# Patient Record
Sex: Female | Born: 1960
Health system: Southern US, Community
[De-identification: ages and names within clinical notes are randomized; demographics above are authoritative.]

## PROBLEM LIST (undated history)

## (undated) DIAGNOSIS — G35 Multiple sclerosis: Secondary | ICD-10-CM

## (undated) HISTORY — PX: TUBAL LIGATION: SHX77

## (undated) HISTORY — DX: Multiple sclerosis: G35

---

## 2001-12-27 ENCOUNTER — Encounter: Payer: Self-pay | Admitting: Obstetrics and Gynecology

## 2001-12-27 ENCOUNTER — Ambulatory Visit (HOSPITAL_COMMUNITY): Admission: RE | Admit: 2001-12-27 | Discharge: 2001-12-27 | Payer: Self-pay | Admitting: Obstetrics and Gynecology

## 2002-12-31 ENCOUNTER — Encounter: Payer: Self-pay | Admitting: Obstetrics and Gynecology

## 2002-12-31 ENCOUNTER — Ambulatory Visit (HOSPITAL_COMMUNITY): Admission: RE | Admit: 2002-12-31 | Discharge: 2002-12-31 | Payer: Self-pay | Admitting: Obstetrics and Gynecology

## 2004-01-27 ENCOUNTER — Ambulatory Visit (HOSPITAL_COMMUNITY): Admission: RE | Admit: 2004-01-27 | Discharge: 2004-01-27 | Payer: Self-pay | Admitting: Obstetrics and Gynecology

## 2005-02-02 ENCOUNTER — Ambulatory Visit (HOSPITAL_COMMUNITY): Admission: RE | Admit: 2005-02-02 | Discharge: 2005-02-02 | Payer: Self-pay | Admitting: Obstetrics and Gynecology

## 2006-02-03 ENCOUNTER — Ambulatory Visit (HOSPITAL_COMMUNITY): Admission: RE | Admit: 2006-02-03 | Discharge: 2006-02-03 | Payer: Self-pay | Admitting: Obstetrics and Gynecology

## 2007-03-02 ENCOUNTER — Ambulatory Visit (HOSPITAL_COMMUNITY): Admission: RE | Admit: 2007-03-02 | Discharge: 2007-03-02 | Payer: Self-pay | Admitting: Obstetrics and Gynecology

## 2008-03-27 ENCOUNTER — Other Ambulatory Visit: Admission: RE | Admit: 2008-03-27 | Discharge: 2008-03-27 | Payer: Self-pay | Admitting: Obstetrics and Gynecology

## 2008-03-27 ENCOUNTER — Ambulatory Visit (HOSPITAL_COMMUNITY): Admission: RE | Admit: 2008-03-27 | Discharge: 2008-03-27 | Payer: Self-pay | Admitting: Obstetrics and Gynecology

## 2009-06-25 ENCOUNTER — Other Ambulatory Visit: Admission: RE | Admit: 2009-06-25 | Discharge: 2009-06-25 | Payer: Self-pay | Admitting: Obstetrics and Gynecology

## 2009-06-25 ENCOUNTER — Ambulatory Visit (HOSPITAL_COMMUNITY): Admission: RE | Admit: 2009-06-25 | Discharge: 2009-06-25 | Payer: Self-pay | Admitting: Obstetrics and Gynecology

## 2010-08-04 ENCOUNTER — Ambulatory Visit (HOSPITAL_COMMUNITY): Admission: RE | Admit: 2010-08-04 | Discharge: 2010-08-04 | Payer: Self-pay | Admitting: Obstetrics and Gynecology

## 2010-08-04 ENCOUNTER — Other Ambulatory Visit: Admission: RE | Admit: 2010-08-04 | Discharge: 2010-08-04 | Payer: Self-pay | Admitting: Obstetrics and Gynecology

## 2010-10-11 ENCOUNTER — Encounter: Payer: Self-pay | Admitting: Obstetrics and Gynecology

## 2011-08-10 ENCOUNTER — Other Ambulatory Visit: Payer: Self-pay | Admitting: Obstetrics and Gynecology

## 2011-08-10 DIAGNOSIS — Z139 Encounter for screening, unspecified: Secondary | ICD-10-CM

## 2011-10-05 ENCOUNTER — Other Ambulatory Visit (HOSPITAL_COMMUNITY)
Admission: RE | Admit: 2011-10-05 | Discharge: 2011-10-05 | Disposition: A | Discharge status: Home / Self Care | Payer: BC Managed Care – PPO | Source: Ambulatory Visit | Attending: Obstetrics and Gynecology | Admitting: Obstetrics and Gynecology

## 2011-10-05 ENCOUNTER — Ambulatory Visit (HOSPITAL_COMMUNITY)
Admission: RE | Admit: 2011-10-05 | Discharge: 2011-10-05 | Disposition: A | Discharge status: Home / Self Care | Payer: BC Managed Care – PPO | Source: Ambulatory Visit | Attending: Obstetrics and Gynecology | Admitting: Obstetrics and Gynecology

## 2011-10-05 ENCOUNTER — Other Ambulatory Visit: Payer: Self-pay | Admitting: Adult Health

## 2011-10-05 DIAGNOSIS — Z1231 Encounter for screening mammogram for malignant neoplasm of breast: Secondary | ICD-10-CM | POA: Insufficient documentation

## 2011-10-05 DIAGNOSIS — Z139 Encounter for screening, unspecified: Secondary | ICD-10-CM

## 2011-10-05 DIAGNOSIS — Z01419 Encounter for gynecological examination (general) (routine) without abnormal findings: Secondary | ICD-10-CM | POA: Insufficient documentation

## 2012-10-11 ENCOUNTER — Other Ambulatory Visit: Payer: Self-pay | Admitting: Adult Health

## 2012-10-11 DIAGNOSIS — Z09 Encounter for follow-up examination after completed treatment for conditions other than malignant neoplasm: Secondary | ICD-10-CM

## 2012-10-27 ENCOUNTER — Other Ambulatory Visit (HOSPITAL_COMMUNITY)
Admission: RE | Admit: 2012-10-27 | Discharge: 2012-10-27 | Disposition: A | Discharge status: Home / Self Care | Payer: BC Managed Care – PPO | Source: Ambulatory Visit | Attending: Obstetrics and Gynecology | Admitting: Obstetrics and Gynecology

## 2012-10-27 ENCOUNTER — Other Ambulatory Visit: Payer: Self-pay | Admitting: Adult Health

## 2012-10-27 ENCOUNTER — Ambulatory Visit (HOSPITAL_COMMUNITY)
Admission: RE | Admit: 2012-10-27 | Discharge: 2012-10-27 | Disposition: A | Discharge status: Home / Self Care | Payer: BC Managed Care – PPO | Source: Ambulatory Visit | Attending: Adult Health | Admitting: Adult Health

## 2012-10-27 DIAGNOSIS — Z1151 Encounter for screening for human papillomavirus (HPV): Secondary | ICD-10-CM | POA: Insufficient documentation

## 2012-10-27 DIAGNOSIS — Z09 Encounter for follow-up examination after completed treatment for conditions other than malignant neoplasm: Secondary | ICD-10-CM

## 2012-10-27 DIAGNOSIS — Z01419 Encounter for gynecological examination (general) (routine) without abnormal findings: Secondary | ICD-10-CM | POA: Insufficient documentation

## 2012-10-27 DIAGNOSIS — Z1231 Encounter for screening mammogram for malignant neoplasm of breast: Secondary | ICD-10-CM | POA: Insufficient documentation

## 2013-12-17 ENCOUNTER — Other Ambulatory Visit: Payer: Self-pay | Admitting: Adult Health

## 2013-12-17 DIAGNOSIS — Z1231 Encounter for screening mammogram for malignant neoplasm of breast: Secondary | ICD-10-CM

## 2013-12-18 ENCOUNTER — Ambulatory Visit (HOSPITAL_COMMUNITY)
Admission: RE | Admit: 2013-12-18 | Discharge: 2013-12-18 | Disposition: A | Discharge status: Home / Self Care | Payer: No Typology Code available for payment source | Source: Ambulatory Visit | Attending: Adult Health | Admitting: Adult Health

## 2013-12-18 DIAGNOSIS — Z1231 Encounter for screening mammogram for malignant neoplasm of breast: Secondary | ICD-10-CM | POA: Insufficient documentation

## 2015-02-13 ENCOUNTER — Other Ambulatory Visit: Payer: Self-pay | Admitting: Adult Health

## 2015-02-13 DIAGNOSIS — Z1231 Encounter for screening mammogram for malignant neoplasm of breast: Secondary | ICD-10-CM

## 2015-02-24 ENCOUNTER — Other Ambulatory Visit: Payer: Self-pay | Admitting: Adult Health

## 2015-02-27 ENCOUNTER — Ambulatory Visit (HOSPITAL_COMMUNITY): Payer: No Typology Code available for payment source

## 2015-03-03 ENCOUNTER — Ambulatory Visit (INDEPENDENT_AMBULATORY_CARE_PROVIDER_SITE_OTHER): Payer: No Typology Code available for payment source | Admitting: Adult Health

## 2015-03-03 ENCOUNTER — Ambulatory Visit (HOSPITAL_COMMUNITY)
Admission: RE | Admit: 2015-03-03 | Discharge: 2015-03-03 | Disposition: A | Discharge status: Home / Self Care | Payer: No Typology Code available for payment source | Source: Ambulatory Visit | Attending: Adult Health | Admitting: Adult Health

## 2015-03-03 ENCOUNTER — Encounter: Payer: Self-pay | Admitting: Adult Health

## 2015-03-03 VITALS — BP 140/80 | HR 64 | Ht 66.5 in | Wt 210.5 lb

## 2015-03-03 DIAGNOSIS — Z01419 Encounter for gynecological examination (general) (routine) without abnormal findings: Secondary | ICD-10-CM | POA: Diagnosis not present

## 2015-03-03 DIAGNOSIS — Z1212 Encounter for screening for malignant neoplasm of rectum: Secondary | ICD-10-CM | POA: Diagnosis not present

## 2015-03-03 DIAGNOSIS — Z1231 Encounter for screening mammogram for malignant neoplasm of breast: Secondary | ICD-10-CM | POA: Insufficient documentation

## 2015-03-03 LAB — HEMOCCULT GUIAC POC 1CARD (OFFICE): Fecal Occult Blood, POC: NEGATIVE

## 2015-03-03 NOTE — Patient Instructions (Signed)
Pap and physical in 1 year Mammogram yearly Labs at work 

## 2015-03-03 NOTE — Progress Notes (Signed)
Patient ID: Misty Reed, female   DOB: 12/22/1960, 54 y.o.   MRN: [id number] History of Present Illness: Misty Reed is a 54 year old white female, married in for well woman gyn exam.She had a normal pap with negative HPV 10/27/12.She has MS, but is doing well, works everyday.   Current Medications, Allergies, Past Medical History, Past Surgical History, Family History and Social History were reviewed in [employer] record.     Review of Systems: Patient denies any headaches, hearing loss, fatigue, blurred vision, shortness of breath, chest pain, abdominal pain, problems with bowel movements,  or intercourse. No mood swings.Has some UI at times and has pain in both knees, has had injection x 1, its arthritis related.She is having hot flashes some.    Physical Exam:BP 140/80 mmHg  Pulse 64  Ht 5' 6.5" (1.689 m)  Wt 210 lb 8 oz (95.482 kg)  BMI 33.47 kg/m2 General:  Well developed, well nourished, no acute distress Skin:  Warm and dry,tan Neck:  Midline trachea, normal thyroid, good ROM, no lymphadenopathy Lungs; Clear to auscultation bilaterally Breast:  No dominant palpable mass, retraction, or nipple discharge Cardiovascular: Regular rate and rhythm Abdomen:  Soft, non tender, no hepatosplenomegaly Pelvic:  External genitalia is normal in appearance, no lesions.  The vagina has color, moisture and rugae. Urethra has no lesions or masses. The cervix is bulbous and smooth.  Uterus is felt to be normal size, shape, and contour.  No adnexal masses or tenderness noted.Bladder is non tender, no masses felt. Rectal: Good sphincter tone, no polyps, or hemorrhoids felt.  Hemoccult negative. Extremities/musculoskeletal:  No swelling or varicosities noted, no clubbing or cyanosis Psych:  No mood changes, alert and cooperative,seems happy Has had colonoscopy.  Impression: Well woman gyn exam no pap    Plan: Pap and physical in 1 year Labs at work Mammogram yearly(had  this am at [facility]) Colonoscopy per GI

## 2015-03-05 ENCOUNTER — Other Ambulatory Visit: Payer: Self-pay | Admitting: Adult Health

## 2015-03-05 DIAGNOSIS — R928 Other abnormal and inconclusive findings on diagnostic imaging of breast: Secondary | ICD-10-CM

## 2015-03-06 ENCOUNTER — Other Ambulatory Visit: Payer: Self-pay | Admitting: Adult Health

## 2015-03-06 DIAGNOSIS — R928 Other abnormal and inconclusive findings on diagnostic imaging of breast: Secondary | ICD-10-CM

## 2015-03-07 ENCOUNTER — Telehealth: Payer: Self-pay | Admitting: Adult Health

## 2015-03-07 NOTE — Telephone Encounter (Signed)
Pt called about abnormal mammogram has F/U Wednesday in [employer]

## 2015-03-07 NOTE — Telephone Encounter (Signed)
Left message to call me back.

## 2015-03-12 ENCOUNTER — Ambulatory Visit
Admission: RE | Admit: 2015-03-12 | Discharge: 2015-03-12 | Disposition: A | Discharge status: Home / Self Care | Payer: No Typology Code available for payment source | Source: Ambulatory Visit | Attending: Adult Health | Admitting: Adult Health

## 2015-03-12 DIAGNOSIS — R928 Other abnormal and inconclusive findings on diagnostic imaging of breast: Secondary | ICD-10-CM

## 2015-11-27 ENCOUNTER — Other Ambulatory Visit: Payer: Self-pay | Admitting: Adult Health

## 2015-11-27 DIAGNOSIS — Z1231 Encounter for screening mammogram for malignant neoplasm of breast: Secondary | ICD-10-CM

## 2016-03-15 ENCOUNTER — Encounter: Payer: Self-pay | Admitting: Adult Health

## 2016-03-15 ENCOUNTER — Ambulatory Visit (INDEPENDENT_AMBULATORY_CARE_PROVIDER_SITE_OTHER): Payer: 59 | Admitting: Adult Health

## 2016-03-15 ENCOUNTER — Other Ambulatory Visit (HOSPITAL_COMMUNITY)
Admission: RE | Admit: 2016-03-15 | Discharge: 2016-03-15 | Disposition: A | Discharge status: Home / Self Care | Payer: 59 | Source: Ambulatory Visit | Attending: Adult Health | Admitting: Adult Health

## 2016-03-15 ENCOUNTER — Ambulatory Visit (HOSPITAL_COMMUNITY)
Admission: RE | Admit: 2016-03-15 | Discharge: 2016-03-15 | Disposition: A | Discharge status: Home / Self Care | Payer: 59 | Source: Ambulatory Visit | Attending: Adult Health | Admitting: Adult Health

## 2016-03-15 VITALS — BP 130/90 | HR 66 | Ht 66.0 in | Wt 215.5 lb

## 2016-03-15 DIAGNOSIS — Z1151 Encounter for screening for human papillomavirus (HPV): Secondary | ICD-10-CM | POA: Diagnosis present

## 2016-03-15 DIAGNOSIS — Z01419 Encounter for gynecological examination (general) (routine) without abnormal findings: Secondary | ICD-10-CM

## 2016-03-15 DIAGNOSIS — Z1212 Encounter for screening for malignant neoplasm of rectum: Secondary | ICD-10-CM | POA: Diagnosis not present

## 2016-03-15 DIAGNOSIS — Z1231 Encounter for screening mammogram for malignant neoplasm of breast: Secondary | ICD-10-CM | POA: Diagnosis present

## 2016-03-15 LAB — HEMOCCULT GUIAC POC 1CARD (OFFICE): Fecal Occult Blood, POC: NEGATIVE

## 2016-03-15 NOTE — Progress Notes (Signed)
Patient ID: Misty Reed, female   DOB: 04/12/1961, 55 y.o.   MRN: [id number] History of Present Illness:  Misty Reed is a 55 year old white female, married in for a well woman gyn exam and pap.  Current Medications, Allergies, Past Medical History, Past Surgical History, Family History and Social History were reviewed in [facility] electronic medical record.     Review of Systems: Patient denies any headaches, hearing loss, fatigue, blurred vision, shortness of breath, chest pain, abdominal pain, problems with bowel movements, urination, or intercourse. No joint pain or mood swings.    Physical Exam:BP 130/90 mmHg  Pulse 66  Ht 5\' 6"  (1.676 m)  Wt 215 lb 8 oz (97.75 kg)  BMI 34.80 kg/m2 General:  Well developed, well nourished, no acute distress Skin:  Warm and dry,tan Neck:  Midline trachea, normal thyroid, good ROM, no lymphadenopathy Lungs; Clear to auscultation bilaterally Breast:  No dominant palpable mass, retraction, or nipple discharge Cardiovascular: Regular rate and rhythm Abdomen:  Soft, non tender, no hepatosplenomegaly Pelvic:  External genitalia is normal in appearance, no lesions.  The vagina is normal in appearance. Urethra has no lesions or masses. The cervix is bulbous.Pap with HPV performed.  Uterus is felt to be normal size, shape, and contour.  No adnexal masses or tenderness noted.Bladder is non tender, no masses felt. Rectal: Good sphincter tone, no polyps, or hemorrhoids felt.  Hemoccult negative.+rectocele  Extremities/musculoskeletal:  No swelling or varicosities noted, no clubbing or cyanosis Psych:  No mood changes, alert and cooperative,seems happy   Impression: Well woman gyn exam and pap     Plan: Check CBC,CMP,TSH and lipids,A1c and vitamin D, hept C antibody and HIV Physical in 1 year, pap in 3 if normal Mammogram yearly Colonoscopy per GI

## 2016-03-15 NOTE — Patient Instructions (Signed)
Physical in 1 year, pap in 3 if normal Mammogram yearly Colonoscopy per GI  

## 2016-03-16 ENCOUNTER — Telehealth: Payer: Self-pay | Admitting: Adult Health

## 2016-03-16 LAB — VITAMIN D 25 HYDROXY (VIT D DEFICIENCY, FRACTURES): VIT D 25 HYDROXY: 56 ng/mL (ref 30.0–100.0)

## 2016-03-16 LAB — COMPREHENSIVE METABOLIC PANEL
ALT: 20 IU/L (ref 0–32)
AST: 22 IU/L (ref 0–40)
Albumin/Globulin Ratio: 1.6 (ref 1.2–2.2)
Albumin: 4.1 g/dL (ref 3.5–5.5)
Alkaline Phosphatase: 85 IU/L (ref 39–117)
BILIRUBIN TOTAL: 0.4 mg/dL (ref 0.0–1.2)
BUN/Creatinine Ratio: 24 — ABNORMAL HIGH (ref 9–23)
BUN: 18 mg/dL (ref 6–24)
CHLORIDE: 102 mmol/L (ref 96–106)
CO2: 24 mmol/L (ref 18–29)
Calcium: 9.2 mg/dL (ref 8.7–10.2)
Creatinine, Ser: 0.74 mg/dL (ref 0.57–1.00)
GFR calc non Af Amer: 90 mL/min/{1.73_m2} (ref >59)
GFR, EST AFRICAN AMERICAN: 104 mL/min/{1.73_m2} (ref >59)
GLUCOSE: 82 mg/dL (ref 65–99)
Globulin, Total: 2.5 g/dL (ref 1.5–4.5)
Potassium: 4.6 mmol/L (ref 3.5–5.2)
Sodium: 142 mmol/L (ref 134–144)
TOTAL PROTEIN: 6.6 g/dL (ref 6.0–8.5)

## 2016-03-16 LAB — LIPID PANEL
CHOL/HDL RATIO: 2.6 ratio (ref 0.0–4.4)
CHOLESTEROL TOTAL: 157 mg/dL (ref 100–199)
HDL: 60 mg/dL (ref >39)
LDL Calculated: 86 mg/dL (ref 0–99)
TRIGLYCERIDES: 55 mg/dL (ref 0–149)
VLDL Cholesterol Cal: 11 mg/dL (ref 5–40)

## 2016-03-16 LAB — CBC
HEMOGLOBIN: 13.8 g/dL (ref 11.1–15.9)
Hematocrit: 40.3 % (ref 34.0–46.6)
MCH: 31.3 pg (ref 26.6–33.0)
MCHC: 34.2 g/dL (ref 31.5–35.7)
MCV: 91 fL (ref 79–97)
PLATELETS: 221 10*3/uL (ref 150–379)
RBC: 4.41 x10E6/uL (ref 3.77–5.28)
RDW: 13.5 % (ref 12.3–15.4)
WBC: 5.3 10*3/uL (ref 3.4–10.8)

## 2016-03-16 LAB — HEPATITIS C ANTIBODY: Hep C Virus Ab: <0.1 s/co ratio (ref 0.0–0.9)

## 2016-03-16 LAB — HEMOGLOBIN A1C
ESTIMATED AVERAGE GLUCOSE: 105 mg/dL
HEMOGLOBIN A1C: 5.3 % (ref 4.8–5.6)

## 2016-03-16 LAB — HIV ANTIBODY (ROUTINE TESTING W REFLEX): HIV SCREEN 4TH GENERATION: NONREACTIVE

## 2016-03-16 LAB — CYTOLOGY - PAP

## 2016-03-16 LAB — TSH: TSH: 1.39 u[IU]/mL (ref 0.450–4.500)

## 2016-03-16 NOTE — Telephone Encounter (Signed)
Pt aware labs excellent, all normal

## 2016-03-25 ENCOUNTER — Telehealth: Payer: Self-pay | Admitting: Adult Health

## 2016-03-25 NOTE — Telephone Encounter (Signed)
Left message x 1. JSY 

## 2016-03-26 NOTE — Telephone Encounter (Signed)
Fast busy @ 9:18 am. JSY

## 2016-03-26 NOTE — Telephone Encounter (Signed)
Spoke with pt letting her know pap and mammogram was normal. Pt voiced understanding. JSY

## 2017-03-25 ENCOUNTER — Other Ambulatory Visit: Payer: Self-pay | Admitting: Adult Health

## 2017-03-25 DIAGNOSIS — Z1231 Encounter for screening mammogram for malignant neoplasm of breast: Secondary | ICD-10-CM

## 2017-05-10 ENCOUNTER — Other Ambulatory Visit: Payer: 59 | Admitting: Adult Health

## 2017-05-11 ENCOUNTER — Ambulatory Visit (HOSPITAL_COMMUNITY)
Admission: RE | Admit: 2017-05-11 | Discharge: 2017-05-11 | Disposition: A | Discharge status: Home / Self Care | Payer: 59 | Source: Ambulatory Visit | Attending: Adult Health | Admitting: Adult Health

## 2017-05-11 ENCOUNTER — Encounter: Payer: Self-pay | Admitting: Adult Health

## 2017-05-11 ENCOUNTER — Encounter (INDEPENDENT_AMBULATORY_CARE_PROVIDER_SITE_OTHER): Payer: Self-pay

## 2017-05-11 ENCOUNTER — Ambulatory Visit (HOSPITAL_COMMUNITY): Payer: No Typology Code available for payment source

## 2017-05-11 ENCOUNTER — Ambulatory Visit (INDEPENDENT_AMBULATORY_CARE_PROVIDER_SITE_OTHER): Payer: 59 | Admitting: Adult Health

## 2017-05-11 VITALS — BP 140/92 | HR 91 | Ht 66.0 in | Wt 206.0 lb

## 2017-05-11 DIAGNOSIS — Z1212 Encounter for screening for malignant neoplasm of rectum: Secondary | ICD-10-CM

## 2017-05-11 DIAGNOSIS — Z1211 Encounter for screening for malignant neoplasm of colon: Secondary | ICD-10-CM | POA: Diagnosis not present

## 2017-05-11 DIAGNOSIS — Z1231 Encounter for screening mammogram for malignant neoplasm of breast: Secondary | ICD-10-CM

## 2017-05-11 DIAGNOSIS — Z01419 Encounter for gynecological examination (general) (routine) without abnormal findings: Secondary | ICD-10-CM

## 2017-05-11 LAB — HEMOCCULT GUIAC POC 1CARD (OFFICE): FECAL OCCULT BLD: NEGATIVE

## 2017-05-11 NOTE — Patient Instructions (Addendum)
Physical in 1 year Pap in 2020 Mammogram yearly Labs next year

## 2017-05-11 NOTE — Progress Notes (Signed)
Patient ID: Misty Reed, female   DOB: Jun 11, 1961, 56 y.o.   MRN: [id number] History of Present Illness: Misty Reed is a 56 year old white female, married in for a well woman gyn exam,she had normal pap with negative HPV 03/15/17.She gets flu vaccine at work, has had Prevnar and I advised to get shingles vaccine, and check on Tdap. PCP is [facility].   Current Medications, Allergies, Past Medical History, Past Surgical History, Family History and Social History were reviewed in [employer] record.     Review of Systems: Patient denies any headaches, hearing loss, fatigue, blurred vision, shortness of breath, chest pain, abdominal pain, problems with bowel movements, urination, or intercourse. No joint pain or mood swings.    Physical Exam:BP (!) 140/92 (BP Location: Left Arm, Patient Position: Sitting, Cuff Size: Large)   Pulse 91   Ht 5\' 6"  (1.676 m)   Wt 206 lb (93.4 kg)   BMI 33.25 kg/m  General:  Well developed, well nourished, no acute distress Skin:  Warm and dry,tan Neck:  Midline trachea, normal thyroid, good ROM, no lymphadenopathy,no carotid bruits heard Lungs; Clear to auscultation bilaterally Breast:  No dominant palpable mass, retraction, or nipple discharge Cardiovascular: Regular rate and rhythm Abdomen:  Soft, non tender, no hepatosplenomegaly Pelvic:  External genitalia is normal in appearance, no lesions.  The vagina is normal in appearance. Urethra has no lesions or masses. The cervix is bulbous.  Uterus is felt to be normal size, shape, and contour.  No adnexal masses or tenderness noted.Bladder is non tender, no masses felt. Rectal: Good sphincter tone, no polyps, or hemorrhoids felt.  Hemoccult negative. Extremities/musculoskeletal:  No swelling or varicosities noted, no clubbing or cyanosis Psych:  No mood changes, alert and cooperative,seems happy PHQ 2 score 0.  Impression: 1. Well woman exam with routine gynecological exam   2. Screening  for colorectal cancer       Plan: Physical in 1 year Pap in 2020 Mammogram yearly Labs next year

## 2018-05-23 ENCOUNTER — Other Ambulatory Visit: Payer: Self-pay | Admitting: Adult Health

## 2018-05-23 DIAGNOSIS — Z1231 Encounter for screening mammogram for malignant neoplasm of breast: Secondary | ICD-10-CM

## 2018-06-21 ENCOUNTER — Ambulatory Visit (HOSPITAL_COMMUNITY)
Admission: RE | Admit: 2018-06-21 | Discharge: 2018-06-21 | Disposition: A | Discharge status: Home / Self Care | Payer: 59 | Source: Ambulatory Visit | Attending: Adult Health | Admitting: Adult Health

## 2018-06-21 ENCOUNTER — Other Ambulatory Visit: Payer: Self-pay

## 2018-06-21 ENCOUNTER — Ambulatory Visit (INDEPENDENT_AMBULATORY_CARE_PROVIDER_SITE_OTHER): Payer: 59 | Admitting: Adult Health

## 2018-06-21 ENCOUNTER — Encounter: Payer: Self-pay | Admitting: Adult Health

## 2018-06-21 VITALS — BP 136/89 | HR 63 | Ht 67.0 in | Wt 214.6 lb

## 2018-06-21 DIAGNOSIS — Z1212 Encounter for screening for malignant neoplasm of rectum: Secondary | ICD-10-CM

## 2018-06-21 DIAGNOSIS — Z1211 Encounter for screening for malignant neoplasm of colon: Secondary | ICD-10-CM

## 2018-06-21 DIAGNOSIS — Z01419 Encounter for gynecological examination (general) (routine) without abnormal findings: Secondary | ICD-10-CM | POA: Diagnosis not present

## 2018-06-21 DIAGNOSIS — Z1231 Encounter for screening mammogram for malignant neoplasm of breast: Secondary | ICD-10-CM | POA: Insufficient documentation

## 2018-06-21 LAB — HEMOCCULT GUIAC POC 1CARD (OFFICE): FECAL OCCULT BLD: NEGATIVE

## 2018-06-21 NOTE — Progress Notes (Signed)
Patient ID: MENDY CHOU, female   DOB: [birth year], 57 y.o.   MRN: [id number] History of Present Illness: Misty Reed is a 57 year old white female, married in for well woman gyn exam, she had normal pap with negative HPV, 03/15/16.She is still working and is active.She had mammogram this morning and has had dermatology evaluation and had one AK  removed. PCP is Emilio Math PA at [facility].   Current Medications, Allergies, Past Medical History, Past Surgical History, Family History and Social History were reviewed in [employer] record.     Review of Systems: Patient denies any headaches, hearing loss, fatigue, blurred vision, shortness of breath, chest pain, abdominal pain, problems with bowel movements, urination, or intercourse. No joint pain or mood swings.    Physical Exam:BP 136/89 (BP Location: Right Arm, Patient Position: Sitting, Cuff Size: Normal)   Pulse 63   Ht 5\' 7"  (1.702 m)   Wt 214 lb 9.6 oz (97.3 kg)   BMI 33.61 kg/m  General:  Well developed, well nourished, no acute distress Skin:  Warm and dry Neck:  Midline trachea, normal thyroid, good ROM, no lymphadenopathy Lungs; Clear to auscultation bilaterally Breast:  No dominant palpable mass, retraction, or nipple discharge Cardiovascular: Regular rate and rhythm Abdomen:  Soft, non tender, no hepatosplenomegaly Pelvic:  External genitalia is normal in appearance, no lesions.  The vagina is normal in appearance. Urethra has no lesions or masses. The cervix is bulbous.  Uterus is felt to be normal size, shape, and contour.  No adnexal masses or tenderness noted.Bladder is non tender, no masses felt. Rectal: Good sphincter tone, no polyps, or hemorrhoids felt.  Hemoccult negative. Extremities/musculoskeletal:  No swelling or varicosities noted, no clubbing or cyanosis Psych:  No mood changes, alert and cooperative,seems happy PHQ 2 score 0. Examination chaperoned by amanda Rash LPN. She is requesting  labs, had toast today.   Impression: 1. Well woman exam with routine gynecological exam   2. Screening for colorectal cancer       Plan: Check CBC,CMP,TSH and lipids Pap and physical in 1 year Mammogram yearly,she had today and it was normal

## 2018-06-22 ENCOUNTER — Telehealth: Payer: Self-pay | Admitting: Adult Health

## 2018-06-22 LAB — COMPREHENSIVE METABOLIC PANEL
A/G RATIO: 1.7 (ref 1.2–2.2)
ALBUMIN: 4.2 g/dL (ref 3.5–5.5)
ALT: 20 IU/L (ref 0–32)
AST: 19 IU/L (ref 0–40)
Alkaline Phosphatase: 82 IU/L (ref 39–117)
BUN / CREAT RATIO: 31 — AB (ref 9–23)
BUN: 22 mg/dL (ref 6–24)
Bilirubin Total: 0.5 mg/dL (ref 0.0–1.2)
CALCIUM: 9.2 mg/dL (ref 8.7–10.2)
CO2: 28 mmol/L (ref 20–29)
Chloride: 100 mmol/L (ref 96–106)
Creatinine, Ser: 0.71 mg/dL (ref 0.57–1.00)
GFR, EST AFRICAN AMERICAN: 108 mL/min/{1.73_m2} (ref >59)
GFR, EST NON AFRICAN AMERICAN: 94 mL/min/{1.73_m2} (ref >59)
GLOBULIN, TOTAL: 2.5 g/dL (ref 1.5–4.5)
Glucose: 86 mg/dL (ref 65–99)
POTASSIUM: 3.9 mmol/L (ref 3.5–5.2)
SODIUM: 140 mmol/L (ref 134–144)
TOTAL PROTEIN: 6.7 g/dL (ref 6.0–8.5)

## 2018-06-22 LAB — LIPID PANEL
CHOL/HDL RATIO: 2.7 ratio (ref 0.0–4.4)
Cholesterol, Total: 152 mg/dL (ref 100–199)
HDL: 56 mg/dL (ref >39)
LDL CALC: 84 mg/dL (ref 0–99)
Triglycerides: 62 mg/dL (ref 0–149)
VLDL Cholesterol Cal: 12 mg/dL (ref 5–40)

## 2018-06-22 LAB — CBC
HEMATOCRIT: 39.2 % (ref 34.0–46.6)
Hemoglobin: 13.4 g/dL (ref 11.1–15.9)
MCH: 30.7 pg (ref 26.6–33.0)
MCHC: 34.2 g/dL (ref 31.5–35.7)
MCV: 90 fL (ref 79–97)
Platelets: 223 10*3/uL (ref 150–450)
RBC: 4.36 x10E6/uL (ref 3.77–5.28)
RDW: 13 % (ref 12.3–15.4)
WBC: 4.5 10*3/uL (ref 3.4–10.8)

## 2018-06-22 LAB — TSH: TSH: 1.96 u[IU]/mL (ref 0.450–4.500)

## 2018-06-22 NOTE — Telephone Encounter (Signed)
Left message that labs look good 

## 2019-02-20 DIAGNOSIS — G35 Multiple sclerosis: Secondary | ICD-10-CM | POA: Diagnosis not present

## 2019-04-05 DIAGNOSIS — R0989 Other specified symptoms and signs involving the circulatory and respiratory systems: Secondary | ICD-10-CM | POA: Diagnosis not present

## 2019-06-15 DIAGNOSIS — Z23 Encounter for immunization: Secondary | ICD-10-CM | POA: Diagnosis not present

## 2019-07-06 ENCOUNTER — Other Ambulatory Visit (HOSPITAL_COMMUNITY): Payer: Self-pay | Admitting: Adult Health

## 2019-07-06 ENCOUNTER — Telehealth: Payer: Self-pay | Admitting: Adult Health

## 2019-07-06 DIAGNOSIS — Z1231 Encounter for screening mammogram for malignant neoplasm of breast: Secondary | ICD-10-CM

## 2019-07-06 NOTE — Telephone Encounter (Signed)
Patient called, made a pap/physical for 09/24/17 at 9:30am with you.  She is also wanting to get her mammogram for the same day.  She is requesting an order so that she can go ahead and schedule it.

## 2019-07-06 NOTE — Telephone Encounter (Signed)
Left message she can call for mammogram without order

## 2019-09-25 ENCOUNTER — Telehealth: Payer: Self-pay | Admitting: Adult Health

## 2019-09-25 ENCOUNTER — Other Ambulatory Visit: Payer: 59 | Admitting: Adult Health

## 2019-09-25 NOTE — Telephone Encounter (Signed)
Called patient regarding appointment scheduled in our office and advised to come alone to the visit, however, a support person, over age 61, may accompany her to appointment if assistance is needed for safety or care concerns. Otherwise, support persons should remain outside until the visit is complete.   Prescreen questions asked: 1. Any of the following symptoms of COVID such as chills, fever, cough, shortness of breath, muscle pain, diarrhea, rash, vomiting, abdominal pain, red eye, weakness, bruising, bleeding, joint pain, loss of taste or smell, a severe headache, sore throat, fatigue 2. Any exposure to anyone suspected or confirmed of having COVID-19 3. Awaiting test results for COVID-19  Also,to keep you safe, please use the provided hand sanitizer when you enter the office. We are asking everyone in the office to wear a mask to help prevent the spread of germs. If you have a mask of your own, please wear it to your appointment, if not, we are happy to provide one for you.  Thank you for understanding and your cooperation.    [facility] Staff      

## 2019-09-26 ENCOUNTER — Other Ambulatory Visit: Payer: Self-pay

## 2019-09-26 ENCOUNTER — Ambulatory Visit (INDEPENDENT_AMBULATORY_CARE_PROVIDER_SITE_OTHER): Payer: BC Managed Care – PPO | Admitting: Adult Health

## 2019-09-26 ENCOUNTER — Other Ambulatory Visit (HOSPITAL_COMMUNITY)
Admission: RE | Admit: 2019-09-26 | Discharge: 2019-09-26 | Disposition: A | Discharge status: Home / Self Care | Payer: BC Managed Care – PPO | Source: Ambulatory Visit | Attending: Adult Health | Admitting: Adult Health

## 2019-09-26 ENCOUNTER — Ambulatory Visit (HOSPITAL_COMMUNITY)
Admission: RE | Admit: 2019-09-26 | Discharge: 2019-09-26 | Disposition: A | Discharge status: Home / Self Care | Payer: BC Managed Care – PPO | Source: Ambulatory Visit | Attending: Adult Health | Admitting: Adult Health

## 2019-09-26 ENCOUNTER — Encounter: Payer: Self-pay | Admitting: Adult Health

## 2019-09-26 VITALS — BP 141/96 | HR 66 | Ht 67.0 in | Wt 216.8 lb

## 2019-09-26 DIAGNOSIS — Z1151 Encounter for screening for human papillomavirus (HPV): Secondary | ICD-10-CM | POA: Insufficient documentation

## 2019-09-26 DIAGNOSIS — Z1231 Encounter for screening mammogram for malignant neoplasm of breast: Secondary | ICD-10-CM | POA: Diagnosis not present

## 2019-09-26 DIAGNOSIS — Z01419 Encounter for gynecological examination (general) (routine) without abnormal findings: Secondary | ICD-10-CM | POA: Insufficient documentation

## 2019-09-26 DIAGNOSIS — Z1212 Encounter for screening for malignant neoplasm of rectum: Secondary | ICD-10-CM | POA: Diagnosis not present

## 2019-09-26 DIAGNOSIS — Z1211 Encounter for screening for malignant neoplasm of colon: Secondary | ICD-10-CM | POA: Diagnosis not present

## 2019-09-26 LAB — HEMOCCULT GUIAC POC 1CARD (OFFICE): Fecal Occult Blood, POC: NEGATIVE

## 2019-09-26 NOTE — Progress Notes (Signed)
Patient ID: Misty Reed, female   DOB: July 04, 1961, 59 y.o.   MRN: [id number] History of Present Illness: Misty Reed is a 59 year old white female, married, PM in for a well woman gyn exam and pap.She is working from home.  She had shingle vaccine this year.  PCP is Misty Honey NP.   Current Medications, Allergies, Past Medical History, Past Surgical History, Family History and Social History were reviewed in [employer] record.     Review of Systems: Patient denies any headaches, hearing loss, fatigue, blurred vision, shortness of breath, chest pain, abdominal pain, problems with bowel movements, urination, or intercourse. No joint pain or mood swings.    Physical Exam:BP (!) 141/96 (BP Location: Left Arm, Patient Position: Sitting, Cuff Size: Normal)   Pulse 66   Ht 5\' 7"  (1.702 m)   Wt 216 lb 12.8 oz (98.3 kg)   BMI 33.96 kg/m  General:  Well developed, well nourished, no acute distress Skin:  Warm and dry Neck:  Midline trachea, normal thyroid, good ROM, no lymphadenopathy,no carotid bruits heard Lungs; Clear to auscultation bilaterally Breast:  No dominant palpable mass, retraction, or nipple discharge Cardiovascular: Regular rate and rhythm Abdomen:  Soft, non tender, no hepatosplenomegaly Pelvic:  External genitalia is normal in appearance, no lesions.  The vagina is normal in appearance. Urethra has no lesions or masses. The cervix is bulbous. Pap with high risk HPV 16/18 genotyping performed. Uterus is felt to be normal size, shape, and contour.  No adnexal masses or tenderness noted.Bladder is non tender, no masses felt. Rectal: Good sphincter tone, no polyps, or hemorrhoids felt.  Hemoccult negative. Extremities/musculoskeletal:  No swelling or varicosities noted, no clubbing or cyanosis Psych:  No mood changes, alert and cooperative,seems happy Fall risk is low PHQ 2 score is 0 Examination chaperoned by LPN Reviewed mammogram  with her at visit, it was negative for malignancy, she had it this morning.   Impression and Plan: 1. Encounter for gynecological examination with Papanicolaou smear of cervix Pap sent Physical in 1 year Pap in 3 if normal Mammogram yearly Check CBC,CMP,TSH and lipids   2. Screening for colorectal cancer Colonoscopy per GI, had at 51

## 2019-09-27 LAB — COMPREHENSIVE METABOLIC PANEL
ALT: 27 IU/L (ref 0–32)
AST: 30 IU/L (ref 0–40)
Albumin/Globulin Ratio: 1.7 (ref 1.2–2.2)
Albumin: 4.5 g/dL (ref 3.8–4.9)
Alkaline Phosphatase: 96 IU/L (ref 39–117)
BUN/Creatinine Ratio: 33 — ABNORMAL HIGH (ref 12–28)
BUN: 25 mg/dL (ref 8–27)
Bilirubin Total: 0.5 mg/dL (ref 0.0–1.2)
CO2: 26 mmol/L (ref 20–29)
Calcium: 9.4 mg/dL (ref 8.7–10.3)
Chloride: 98 mmol/L (ref 96–106)
Creatinine, Ser: 0.76 mg/dL (ref 0.57–1.00)
GFR calc Af Amer: 99 mL/min/{1.73_m2} (ref >59)
GFR calc non Af Amer: 86 mL/min/{1.73_m2} (ref >59)
Globulin, Total: 2.6 g/dL (ref 1.5–4.5)
Glucose: 80 mg/dL (ref 65–99)
Potassium: 4 mmol/L (ref 3.5–5.2)
Sodium: 137 mmol/L (ref 134–144)
Total Protein: 7.1 g/dL (ref 6.0–8.5)

## 2019-09-27 LAB — CBC
Hematocrit: 40.6 % (ref 34.0–46.6)
Hemoglobin: 14.2 g/dL (ref 11.1–15.9)
MCH: 31.5 pg (ref 26.6–33.0)
MCHC: 35 g/dL (ref 31.5–35.7)
MCV: 90 fL (ref 79–97)
Platelets: 220 10*3/uL (ref 150–450)
RBC: 4.51 x10E6/uL (ref 3.77–5.28)
RDW: 13 % (ref 11.7–15.4)
WBC: 5.6 10*3/uL (ref 3.4–10.8)

## 2019-09-27 LAB — TSH: TSH: 1.29 u[IU]/mL (ref 0.450–4.500)

## 2019-09-27 LAB — LIPID PANEL
Chol/HDL Ratio: 3 ratio (ref 0.0–4.4)
Cholesterol, Total: 170 mg/dL (ref 100–199)
HDL: 56 mg/dL (ref >39)
LDL Chol Calc (NIH): 100 mg/dL — ABNORMAL HIGH (ref 0–99)
Triglycerides: 73 mg/dL (ref 0–149)
VLDL Cholesterol Cal: 14 mg/dL (ref 5–40)

## 2019-09-28 ENCOUNTER — Telehealth: Payer: Self-pay | Admitting: Adult Health

## 2019-09-28 LAB — CYTOLOGY - PAP
Comment: NEGATIVE
Diagnosis: NEGATIVE
High risk HPV: NEGATIVE

## 2019-09-28 NOTE — Telephone Encounter (Signed)
Left message that labs look good, BUN/Creat ration 33 increase fluids

## 2019-11-21 DIAGNOSIS — Z23 Encounter for immunization: Secondary | ICD-10-CM | POA: Diagnosis not present

## 2020-05-15 DIAGNOSIS — G35 Multiple sclerosis: Secondary | ICD-10-CM | POA: Diagnosis not present

## 2020-05-15 DIAGNOSIS — Z79899 Other long term (current) drug therapy: Secondary | ICD-10-CM | POA: Diagnosis not present

## 2020-05-20 DIAGNOSIS — Z23 Encounter for immunization: Secondary | ICD-10-CM | POA: Diagnosis not present

## 2020-06-03 DIAGNOSIS — Z23 Encounter for immunization: Secondary | ICD-10-CM | POA: Diagnosis not present

## 2020-08-27 ENCOUNTER — Other Ambulatory Visit (HOSPITAL_COMMUNITY): Payer: Self-pay | Admitting: Adult Health

## 2020-08-27 DIAGNOSIS — Z1231 Encounter for screening mammogram for malignant neoplasm of breast: Secondary | ICD-10-CM

## 2020-09-28 IMAGING — MG DIGITAL SCREENING BILATERAL MAMMOGRAM WITH TOMO AND CAD
6 of 10 series · 6 of 30 positions shown · non-contrast
Comparison: Previous exam(s).

CLINICAL DATA: Screening.

EXAM:
DIGITAL SCREENING BILATERAL MAMMOGRAM WITH TOMO AND CAD

[L MLO synth-2D (1 of 2)]
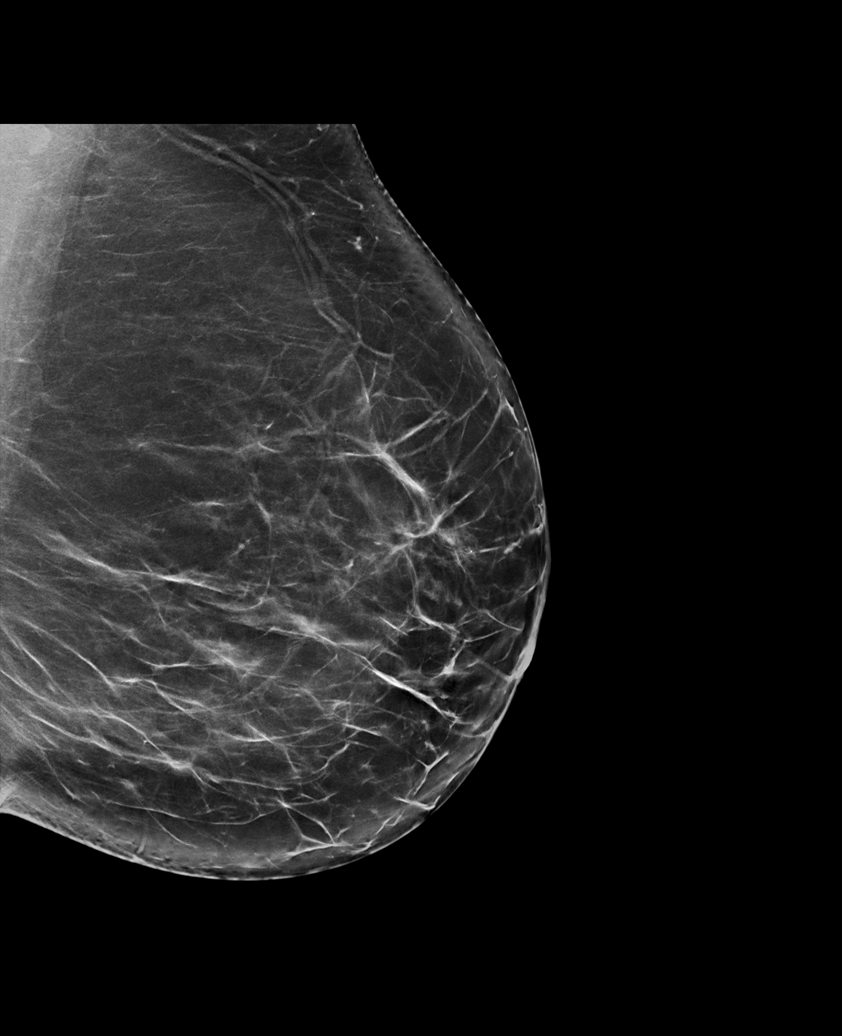

[R MLO synth-2D]
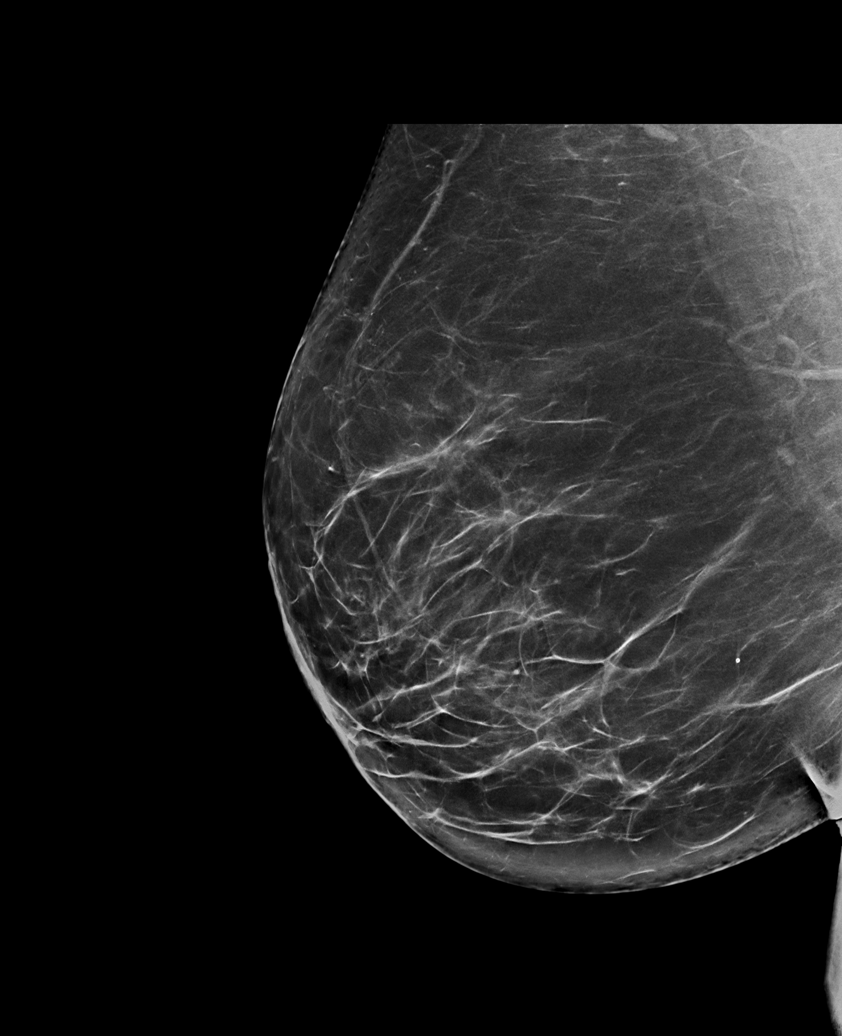

[L CC synth-2D]
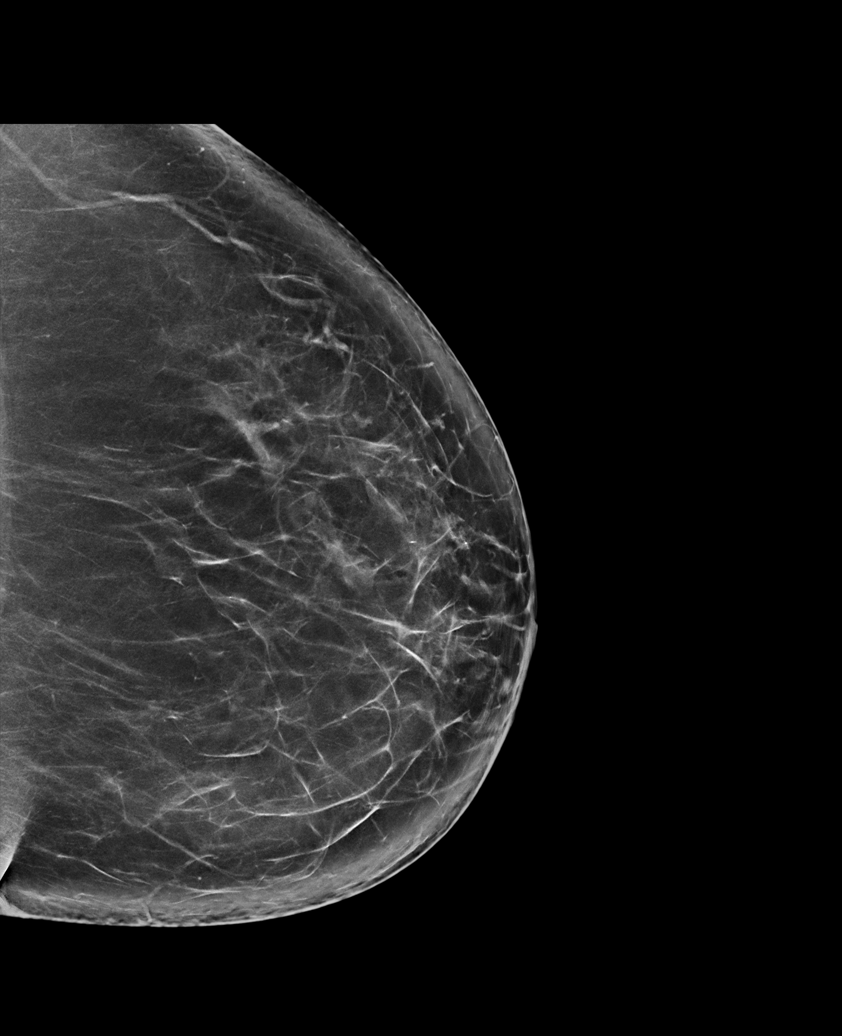

[R CC synth-2D]
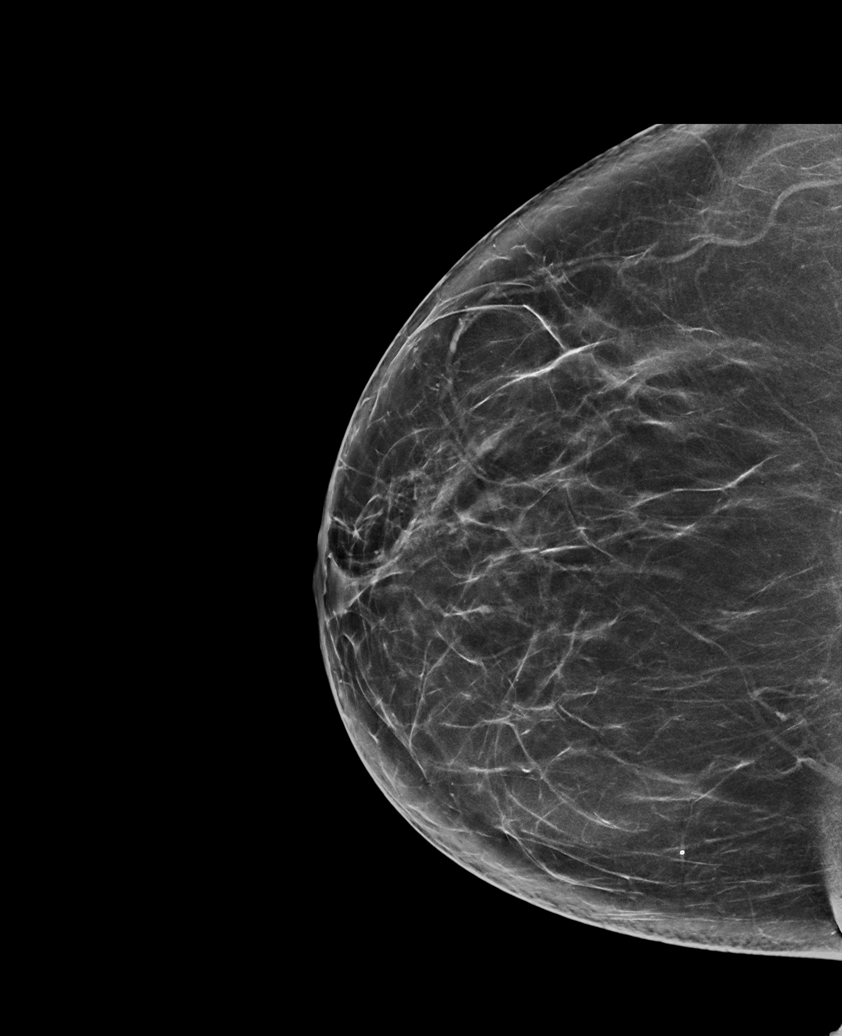

[L MLO synth-2D (2 of 2)]
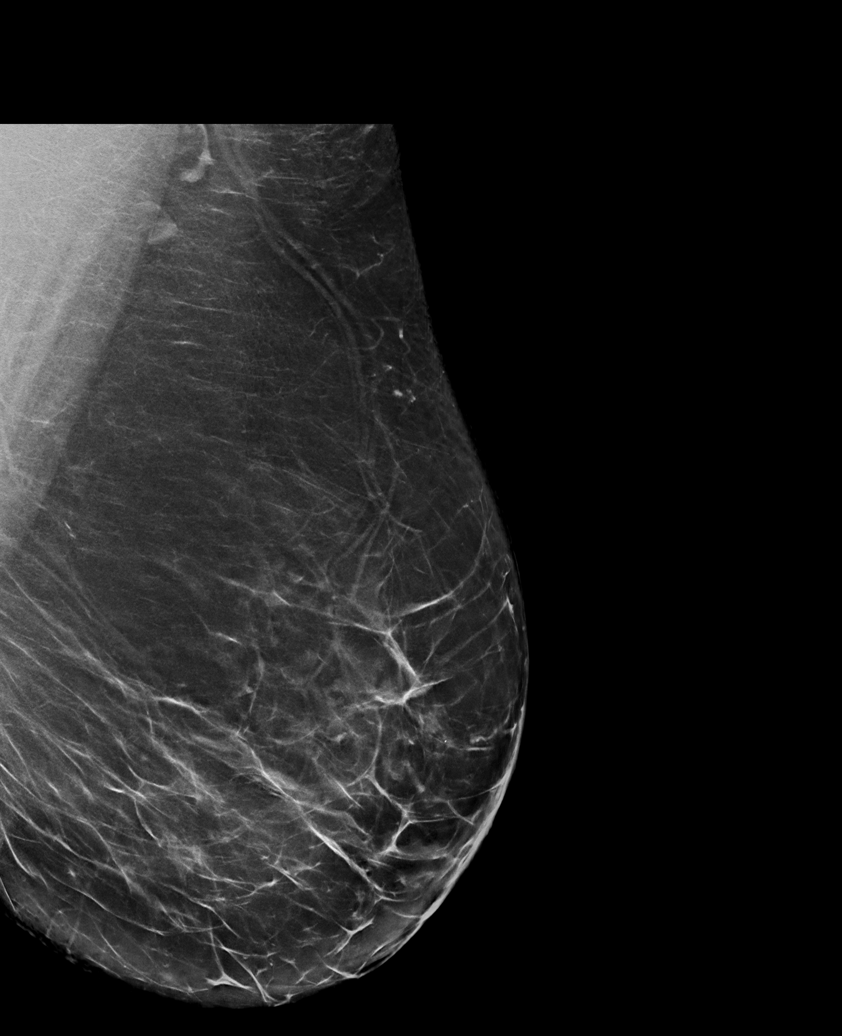

[R CC tomo · tomo slice 43/85.0]
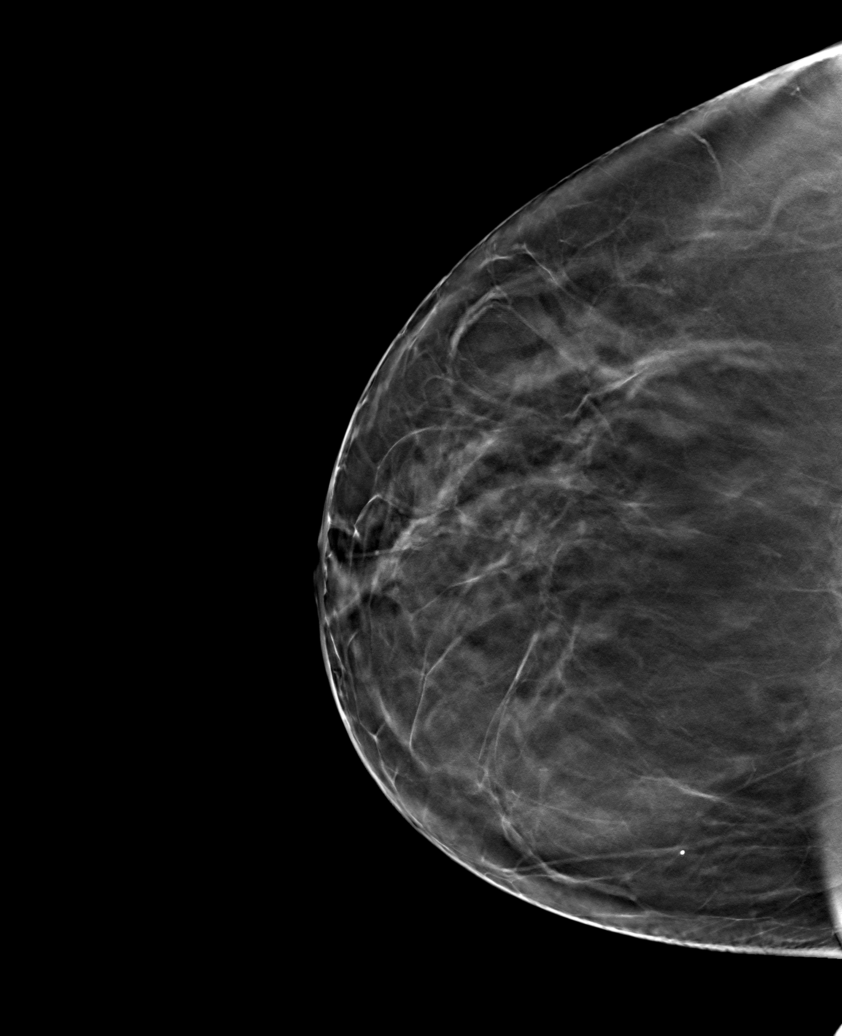

[6 of 30 positions shown; findings below may reference images not displayed]

ACR Breast Density Category b: There are scattered areas of
fibroglandular density.
FINDINGS: There are no findings suspicious for malignancy. Images were
processed with CAD.
IMPRESSION: No mammographic evidence of malignancy. A result letter of this
screening mammogram will be mailed directly to the patient.

RECOMMENDATION:
Screening mammogram in one year. (Code:[id number])

BI-RADS CATEGORY  1: Negative.

## 2020-10-01 ENCOUNTER — Ambulatory Visit (HOSPITAL_COMMUNITY): Payer: BC Managed Care – PPO

## 2020-10-09 ENCOUNTER — Other Ambulatory Visit: Payer: Self-pay

## 2020-10-09 ENCOUNTER — Encounter: Payer: Self-pay | Admitting: Adult Health

## 2020-10-09 ENCOUNTER — Ambulatory Visit (HOSPITAL_COMMUNITY)
Admission: RE | Admit: 2020-10-09 | Discharge: 2020-10-09 | Disposition: A | Discharge status: Home / Self Care | Payer: BC Managed Care – PPO | Source: Ambulatory Visit | Attending: Adult Health | Admitting: Adult Health

## 2020-10-09 ENCOUNTER — Ambulatory Visit (INDEPENDENT_AMBULATORY_CARE_PROVIDER_SITE_OTHER): Payer: BC Managed Care – PPO | Admitting: Adult Health

## 2020-10-09 VITALS — BP 148/87 | HR 77 | Ht 66.0 in | Wt 222.0 lb

## 2020-10-09 DIAGNOSIS — Z1211 Encounter for screening for malignant neoplasm of colon: Secondary | ICD-10-CM

## 2020-10-09 DIAGNOSIS — Z1231 Encounter for screening mammogram for malignant neoplasm of breast: Secondary | ICD-10-CM | POA: Insufficient documentation

## 2020-10-09 DIAGNOSIS — Z01419 Encounter for gynecological examination (general) (routine) without abnormal findings: Secondary | ICD-10-CM | POA: Insufficient documentation

## 2020-10-09 LAB — HEMOCCULT GUIAC POC 1CARD (OFFICE): Fecal Occult Blood, POC: NEGATIVE

## 2020-10-09 NOTE — Progress Notes (Signed)
Patient ID: Misty Reed, female   DOB: [birth year], 60 y.o.   MRN: [id number] History of Present Illness: Misty Reed is a 60 year old white female,married, PM in for a well woman gyn exam,she had a normal pap with negative HPV 09/26/19. She is working from home and has had a 3 COVID vaccines and has gotten the shingles vaccine too. She had mammogram this am. She is see [facility] for her MS. PCP is Kristen Price,NP.   Current Medications, Allergies, Past Medical History, Past Surgical History, Family History and Social History were reviewed in [employer] record.     Review of Systems: Patient denies any headaches, hearing loss, fatigue, blurred vision, shortness of breath, chest pain, abdominal pain, problems with bowel movements, urination, or intercourse. No joint pain or mood swings.    Physical Exam:BP (!) 148/87 (BP Location: Right Arm, Patient Position: Sitting, Cuff Size: Large)   Pulse 77   Ht 5\' 6"  (1.676 m)   Wt 222 lb (100.7 kg)   BMI 35.83 kg/m  General:  Well developed, well nourished, no acute distress Skin:  Warm and dry Neck:  Midline trachea, normal thyroid, good ROM, no lymphadenopathy,no carotid bruits heard Lungs; Clear to auscultation bilaterally Breast:  No dominant palpable mass, retraction, or nipple discharge Cardiovascular: Regular rate and rhythm Abdomen:  Soft, non tender, no hepatosplenomegaly Pelvic:  External genitalia is normal in appearance, no lesions.  The vagina is pale with loss of moisture and rugae. Urethra has no lesions or masses. The cervix is smooth.  Uterus is felt to be normal size, shape, and contour.  No adnexal masses or tenderness noted.Bladder is non tender, no masses felt. Rectal: Good sphincter tone, no polyps, or hemorrhoids felt.  Hemoccult negative. Extremities/musculoskeletal:  No swelling or varicosities noted, no clubbing or cyanosis Psych:  No mood changes, alert and cooperative,seems happy AA is 0 Fall  risk is low PHQ 9 score is 1 GAD 7 score is 1  Upstream - 10/09/20 1024      Pregnancy Intention Screening   Does the patient want to become pregnant in the next year? No    Does the patient's partner want to become pregnant in the next year? No    Would the patient like to discuss contraceptive options today? No      Contraception Wrap Up   Current Method Female Sterilization   PM   End Method Female Sterilization   PM   Contraception Counseling Provided No         Examination chaperoned by 10/11/20 LPN  Impression and Plan: 1. Encounter for well woman exam with routine gynecological exam Pap in 2024 Physical in 1 year Mammogram yearly Colonoscopy 2023 Check CMP had elevated BUN/creatinine ration   2. Encounter for screening fecal occult blood testing

## 2020-10-10 LAB — COMPREHENSIVE METABOLIC PANEL
ALT: 36 IU/L — ABNORMAL HIGH (ref 0–32)
AST: 35 IU/L (ref 0–40)
Albumin/Globulin Ratio: 1.5 (ref 1.2–2.2)
Albumin: 4.3 g/dL (ref 3.8–4.8)
Alkaline Phosphatase: 109 IU/L (ref 44–121)
BUN/Creatinine Ratio: 24 (ref 12–28)
BUN: 20 mg/dL (ref 8–27)
Bilirubin Total: 0.3 mg/dL (ref 0.0–1.2)
CO2: 23 mmol/L (ref 20–29)
Calcium: 9.2 mg/dL (ref 8.7–10.3)
Chloride: 105 mmol/L (ref 96–106)
Creatinine, Ser: 0.83 mg/dL (ref 0.57–1.00)
GFR calc Af Amer: 87 mL/min/{1.73_m2} (ref >59)
GFR calc non Af Amer: 76 mL/min/{1.73_m2} (ref >59)
Globulin, Total: 2.9 g/dL (ref 1.5–4.5)
Glucose: 86 mg/dL (ref 65–99)
Potassium: 4.2 mmol/L (ref 3.5–5.2)
Sodium: 143 mmol/L (ref 134–144)
Total Protein: 7.2 g/dL (ref 6.0–8.5)

## 2020-10-13 ENCOUNTER — Telehealth: Payer: Self-pay | Admitting: Adult Health

## 2020-10-13 NOTE — Telephone Encounter (Signed)
Misty Reed is aware of labs and mammogram

## 2020-11-11 DIAGNOSIS — I10 Essential (primary) hypertension: Secondary | ICD-10-CM | POA: Diagnosis not present

## 2020-11-11 DIAGNOSIS — E669 Obesity, unspecified: Secondary | ICD-10-CM | POA: Diagnosis not present

## 2020-11-11 DIAGNOSIS — G35 Multiple sclerosis: Secondary | ICD-10-CM | POA: Diagnosis not present

## 2020-11-11 DIAGNOSIS — F329 Major depressive disorder, single episode, unspecified: Secondary | ICD-10-CM | POA: Diagnosis not present

## 2020-11-13 DIAGNOSIS — Z1159 Encounter for screening for other viral diseases: Secondary | ICD-10-CM | POA: Diagnosis not present

## 2020-11-13 DIAGNOSIS — Z131 Encounter for screening for diabetes mellitus: Secondary | ICD-10-CM | POA: Diagnosis not present

## 2020-11-13 DIAGNOSIS — I10 Essential (primary) hypertension: Secondary | ICD-10-CM | POA: Diagnosis not present

## 2021-08-20 ENCOUNTER — Other Ambulatory Visit (HOSPITAL_COMMUNITY): Payer: Self-pay | Admitting: Adult Health

## 2021-08-20 DIAGNOSIS — Z1231 Encounter for screening mammogram for malignant neoplasm of breast: Secondary | ICD-10-CM

## 2021-10-26 ENCOUNTER — Encounter: Payer: Self-pay | Admitting: Adult Health

## 2021-10-26 ENCOUNTER — Other Ambulatory Visit: Payer: Self-pay

## 2021-10-26 ENCOUNTER — Ambulatory Visit (INDEPENDENT_AMBULATORY_CARE_PROVIDER_SITE_OTHER): Payer: BC Managed Care – PPO | Admitting: Adult Health

## 2021-10-26 ENCOUNTER — Ambulatory Visit (HOSPITAL_COMMUNITY)
Admission: RE | Admit: 2021-10-26 | Discharge: 2021-10-26 | Disposition: A | Discharge status: Home / Self Care | Payer: BC Managed Care – PPO | Source: Ambulatory Visit | Attending: Adult Health | Admitting: Adult Health

## 2021-10-26 VITALS — BP 147/84 | HR 67 | Ht 66.0 in | Wt 216.0 lb

## 2021-10-26 DIAGNOSIS — Z13220 Encounter for screening for lipoid disorders: Secondary | ICD-10-CM

## 2021-10-26 DIAGNOSIS — Z1231 Encounter for screening mammogram for malignant neoplasm of breast: Secondary | ICD-10-CM | POA: Insufficient documentation

## 2021-10-26 DIAGNOSIS — Z1211 Encounter for screening for malignant neoplasm of colon: Secondary | ICD-10-CM

## 2021-10-26 DIAGNOSIS — Z1329 Encounter for screening for other suspected endocrine disorder: Secondary | ICD-10-CM | POA: Diagnosis not present

## 2021-10-26 DIAGNOSIS — Z01419 Encounter for gynecological examination (general) (routine) without abnormal findings: Secondary | ICD-10-CM | POA: Diagnosis not present

## 2021-10-26 LAB — HEMOCCULT GUIAC POC 1CARD (OFFICE): Fecal Occult Blood, POC: NEGATIVE

## 2021-10-26 NOTE — Progress Notes (Signed)
Patient ID: Misty Reed, female   DOB: [birth year], 61 y.o.   MRN: [id number] History of Present Illness:  Misty Reed is a 61 year old white female,married, PM in for a well woman gyn exam, she had her mammogram this morning. She is still working, for [employer] at home.  She is a new grandma, Ave Reed was born on her birthday, she is a baby girl. PCP is Helene Shoe NP Lab Results  Component Value Date   DIAGPAP  09/26/2019    - Negative for intraepithelial lesion or malignancy (NILM)   HPVHIGH Negative 09/26/2019     Current Medications, Allergies, Past Medical History, Past Surgical History, Family History and Social History were reviewed in [employer] record.     Review of Systems: Patient denies any headaches, hearing loss, fatigue, blurred vision, shortness of breath, chest pain, abdominal pain, problems with bowel movements, urination, or intercourse. No joint pain or mood swings.     Physical Exam:BP (!) 147/84 (BP Location: Right Arm, Patient Position: Sitting, Cuff Size: Normal)    Pulse 67    Ht 5\' 6"  (1.676 m)    Wt 216 lb (98 kg)    BMI 34.86 kg/m   General:  Well developed, well nourished, no acute distress Skin:  Warm and dry Neck:  Midline trachea, normal thyroid, good ROM, no lymphadenopathy,no carotid bruits heard Lungs; Clear to auscultation bilaterally Breast:  No dominant palpable mass, retraction, or nipple discharge Cardiovascular: Regular rate and rhythm Abdomen:  Soft, non tender, no hepatosplenomegaly Pelvic:  External genitalia is normal in appearance, no lesions.  The vagina is pale with loss of rugae.  Urethra has no lesions or masses. The cervix is smooth.  Uterus is felt to be normal size, shape, and contour.  No adnexal masses or tenderness noted.Bladder is non tender, no masses felt. Rectal: Good sphincter tone, no polyps, or hemorrhoids felt.  Hemoccult negative. Extremities/musculoskeletal:  No swelling or varicosities noted, no clubbing  or cyanosis Psych:  No mood changes, alert and cooperative,seems happy AA is 0 Fall risk is low Depression screen [facility] 2/9 10/26/2021 10/09/2020 09/26/2019  Decreased Interest 0 0 0  Down, Depressed, Hopeless 0 0 0  PHQ - 2 Score 0 0 0  Altered sleeping - 1 -  Tired, decreased energy - 0 -  Change in appetite - 0 -  Feeling bad or failure about yourself  - 0 -  Trouble concentrating - 0 -  Moving slowly or fidgety/restless - 0 -  Suicidal thoughts - 0 -  PHQ-9 Score - 1 -    GAD 7 : Generalized Anxiety Score 10/26/2021 10/09/2020  Nervous, Anxious, on Edge 0 0  Control/stop worrying 0 0  Worry too much - different things 0 1  Trouble relaxing 0 0  Restless 0 0  Easily annoyed or irritable 0 0  Afraid - awful might happen 0 0  Total GAD 7 Score 0 1      Upstream - 10/26/21 0834       Pregnancy Intention Screening   Does the patient want to become pregnant in the next year? N/A    Does the patient's partner want to become pregnant in the next year? N/A    Would the patient like to discuss contraceptive options today? N/A      Contraception Wrap Up   Current Method --   postmenopause   End Method --   postmenopause   Contraception Counseling Provided No  Examination chaperoned by Faith Rogue LPN   Impression and Plan: 1. Encounter for well woman exam with routine gynecological exam Pap and physical in 1 year Will check labs, when she is fasting,orders given  - CBC - Comprehensive metabolic panel Had mammogram this am Colonoscopy per GI  2. Encounter for screening fecal occult blood testing - POCT occult blood stool  3. Screening cholesterol level - Lipid panel  4. Screening for thyroid disorder - TSH

## 2021-10-27 LAB — COMPREHENSIVE METABOLIC PANEL
ALT: 21 IU/L (ref 0–32)
AST: 27 IU/L (ref 0–40)
Albumin/Globulin Ratio: 1.5 (ref 1.2–2.2)
Albumin: 4.3 g/dL (ref 3.8–4.8)
Alkaline Phosphatase: 107 IU/L (ref 44–121)
BUN/Creatinine Ratio: 25 (ref 12–28)
BUN: 20 mg/dL (ref 8–27)
Bilirubin Total: 0.4 mg/dL (ref 0.0–1.2)
CO2: 25 mmol/L (ref 20–29)
Calcium: 9.3 mg/dL (ref 8.7–10.3)
Chloride: 104 mmol/L (ref 96–106)
Creatinine, Ser: 0.8 mg/dL (ref 0.57–1.00)
Globulin, Total: 2.8 g/dL (ref 1.5–4.5)
Glucose: 90 mg/dL (ref 70–99)
Potassium: 4.3 mmol/L (ref 3.5–5.2)
Sodium: 142 mmol/L (ref 134–144)
Total Protein: 7.1 g/dL (ref 6.0–8.5)
eGFR: 83 mL/min/{1.73_m2} (ref >59)

## 2021-10-27 LAB — CBC
Hematocrit: 39.6 % (ref 34.0–46.6)
Hemoglobin: 13.7 g/dL (ref 11.1–15.9)
MCH: 28.6 pg (ref 26.6–33.0)
MCHC: 34.6 g/dL (ref 31.5–35.7)
MCV: 83 fL (ref 79–97)
Platelets: 238 10*3/uL (ref 150–450)
RBC: 4.79 x10E6/uL (ref 3.77–5.28)
RDW: 14.6 % (ref 11.7–15.4)
WBC: 5.8 10*3/uL (ref 3.4–10.8)

## 2021-10-27 LAB — LIPID PANEL
Chol/HDL Ratio: 2.8 ratio (ref 0.0–4.4)
Cholesterol, Total: 152 mg/dL (ref 100–199)
HDL: 54 mg/dL (ref >39)
LDL Chol Calc (NIH): 80 mg/dL (ref 0–99)
Triglycerides: 98 mg/dL (ref 0–149)
VLDL Cholesterol Cal: 18 mg/dL (ref 5–40)

## 2021-10-27 LAB — TSH: TSH: 1.86 u[IU]/mL (ref 0.450–4.500)

## 2022-11-01 ENCOUNTER — Other Ambulatory Visit (HOSPITAL_COMMUNITY): Payer: Self-pay | Admitting: Adult Health

## 2022-11-01 DIAGNOSIS — Z1231 Encounter for screening mammogram for malignant neoplasm of breast: Secondary | ICD-10-CM

## 2022-12-27 ENCOUNTER — Ambulatory Visit (HOSPITAL_COMMUNITY)
Admission: RE | Admit: 2022-12-27 | Discharge: 2022-12-27 | Disposition: A | Discharge status: Home / Self Care | Payer: Managed Care, Other (non HMO) | Source: Ambulatory Visit | Attending: Adult Health | Admitting: Adult Health

## 2022-12-27 ENCOUNTER — Encounter: Payer: Self-pay | Admitting: Adult Health

## 2022-12-27 ENCOUNTER — Ambulatory Visit: Payer: Managed Care, Other (non HMO) | Admitting: Adult Health

## 2022-12-27 ENCOUNTER — Other Ambulatory Visit (HOSPITAL_COMMUNITY)
Admission: RE | Admit: 2022-12-27 | Discharge: 2022-12-27 | Disposition: A | Discharge status: Home / Self Care | Payer: Managed Care, Other (non HMO) | Source: Ambulatory Visit | Attending: Adult Health | Admitting: Adult Health

## 2022-12-27 VITALS — BP 141/92 | HR 66 | Ht 67.0 in | Wt 219.0 lb

## 2022-12-27 DIAGNOSIS — Z1211 Encounter for screening for malignant neoplasm of colon: Secondary | ICD-10-CM | POA: Diagnosis not present

## 2022-12-27 DIAGNOSIS — Z1231 Encounter for screening mammogram for malignant neoplasm of breast: Secondary | ICD-10-CM | POA: Diagnosis present

## 2022-12-27 DIAGNOSIS — Z01419 Encounter for gynecological examination (general) (routine) without abnormal findings: Secondary | ICD-10-CM

## 2022-12-27 LAB — HEMOCCULT GUIAC POC 1CARD (OFFICE): Fecal Occult Blood, POC: NEGATIVE

## 2022-12-27 NOTE — Progress Notes (Signed)
Patient ID: Misty Reed, female   DOB: [birth year], 62 y.o.   MRN: [id number] History of Present Illness: Misty Reed is a 62 year old white female,married,PM in for a well woman gyn exam and pap. She had mammogram this morning.  She is still working and has 1 yo grand daughter Ave Filter and grand son due in July, Bo.   PCP is Helene Shoe, FNP    Current Medications, Allergies, Past Medical History, Past Surgical History, Family History and Social History were reviewed in [employer] record.     Review of Systems: Patient denies any headaches, hearing loss, fatigue, blurred vision, shortness of breath, chest pain, abdominal pain, problems with bowel movements, urination, or intercourse. No joint pain or mood swings.     Physical Exam:BP (!) 141/92 (BP Location: Right Arm, Patient Position: Sitting, Cuff Size: Large)   Pulse 66   Ht 5\' 7"  (1.702 m)   Wt 219 lb (99.3 kg)   BMI 34.30 kg/m   General:  Well developed, well nourished, no acute distress Skin:  Warm and dry Neck:  Midline trachea, normal thyroid, good ROM, no lymphadenopathy,no carotid bruits heard  Lungs; Clear to auscultation bilaterally Breast:  No dominant palpable mass, retraction, or nipple discharge Cardiovascular: Regular rate and rhythm Abdomen:  Soft, non tender, no hepatosplenomegaly Pelvic:  External genitalia is normal in appearance, no lesions.  The vagina is pale. Urethra has no lesions or masses. The cervix is smooth, pap with HR HPV genotyping performed.  Uterus is felt to be normal size, shape, and contour.  No adnexal masses or tenderness noted.Bladder is non tender, no masses felt. Rectal: Good sphincter tone, no polyps, or hemorrhoids felt.  Hemoccult negative. Extremities/musculoskeletal:  No swelling or varicosities noted, no clubbing or cyanosis Psych:  No mood changes, alert and cooperative,seems happy AA is 0 Fall risk is low    12/27/2022    8:40 AM 10/26/2021    8:38 AM 10/09/2020    10:17 AM  Depression screen PHQ 2/9  Decreased Interest 0 0 0  Down, Depressed, Hopeless 0 0 0  PHQ - 2 Score 0 0 0  Altered sleeping 0  1  Tired, decreased energy 0  0  Change in appetite 0  0  Feeling bad or failure about yourself  0  0  Trouble concentrating 0  0  Moving slowly or fidgety/restless 0  0  Suicidal thoughts 0  0  PHQ-9 Score 0  1       12/27/2022    8:41 AM 10/26/2021    8:40 AM 10/09/2020   10:17 AM  GAD 7 : Generalized Anxiety Score  Nervous, Anxious, on Edge 0 0 0  Control/stop worrying 0 0 0  Worry too much - different things 0 0 1  Trouble relaxing 0 0 0  Restless 0 0 0  Easily annoyed or irritable 0 0 0  Afraid - awful might happen 0 0 0  Total GAD 7 Score 0 0 1    Upstream - 12/27/22 0850       Pregnancy Intention Screening   Does the patient want to become pregnant in the next year? No    Does the patient's partner want to become pregnant in the next year? No    Would the patient like to discuss contraceptive options today? No      Contraception Wrap Up   Current Method Female Sterilization    End Method Female Sterilization    Contraception Counseling  Provided No              Examination chaperoned by Malachy Mood  LPN   Impression and Plan: 1. Encounter for gynecological examination with Papanicolaou smear of cervix Pap sent Pap in 3 years if negative Physical in 1 year Mammogram was this morning Colonoscopy per GI Had labs this year that were good she says Stay active - Cytology - PAP( [place])  2. Encounter for screening fecal occult blood testing Hemoccult negative  - POCT occult blood stool

## 2022-12-29 LAB — CYTOLOGY - PAP
Comment: NEGATIVE
Diagnosis: NEGATIVE
High risk HPV: NEGATIVE

## 2023-11-23 ENCOUNTER — Other Ambulatory Visit (HOSPITAL_COMMUNITY): Payer: Self-pay | Admitting: Adult Health

## 2023-11-23 DIAGNOSIS — Z1231 Encounter for screening mammogram for malignant neoplasm of breast: Secondary | ICD-10-CM

## 2024-01-11 ENCOUNTER — Encounter: Payer: Self-pay | Admitting: Adult Health

## 2024-01-11 ENCOUNTER — Ambulatory Visit: Admitting: Adult Health

## 2024-01-11 ENCOUNTER — Ambulatory Visit (HOSPITAL_COMMUNITY)
Admission: RE | Admit: 2024-01-11 | Discharge: 2024-01-11 | Disposition: A | Discharge status: Home / Self Care | Source: Ambulatory Visit | Attending: Adult Health | Admitting: Adult Health

## 2024-01-11 VITALS — BP 159/91 | HR 64 | Ht 67.0 in | Wt 227.5 lb

## 2024-01-11 DIAGNOSIS — Z01419 Encounter for gynecological examination (general) (routine) without abnormal findings: Secondary | ICD-10-CM | POA: Diagnosis not present

## 2024-01-11 DIAGNOSIS — Z1331 Encounter for screening for depression: Secondary | ICD-10-CM

## 2024-01-11 DIAGNOSIS — Z1211 Encounter for screening for malignant neoplasm of colon: Secondary | ICD-10-CM | POA: Diagnosis not present

## 2024-01-11 DIAGNOSIS — Z1231 Encounter for screening mammogram for malignant neoplasm of breast: Secondary | ICD-10-CM | POA: Insufficient documentation

## 2024-01-11 DIAGNOSIS — I10 Essential (primary) hypertension: Secondary | ICD-10-CM | POA: Insufficient documentation

## 2024-01-11 LAB — HEMOCCULT GUIAC POC 1CARD (OFFICE): Fecal Occult Blood, POC: NEGATIVE

## 2024-01-11 NOTE — Progress Notes (Signed)
 Patient ID: Misty Reed, female   DOB: Sep 03, 1961, 63 y.o.   MRN: [id number] History of Present Illness: Misty Reed is a 63 year old white female, married, PM in for a well woman gyn exam. She is still working from home.     Component Value Date/Time   DIAGPAP  12/27/2022 0852    - Negative for intraepithelial lesion or malignancy (NILM)   DIAGPAP  09/26/2019 0835    - Negative for intraepithelial lesion or malignancy (NILM)   HPVHIGH Negative 12/27/2022 0852   HPVHIGH Negative 09/26/2019 0835   ADEQPAP  12/27/2022 0852    Satisfactory for evaluation. The presence or absence of an   ADEQPAP  12/27/2022 0852    endocervical/transformation zone component cannot be determined because   ADEQPAP of atrophy. 12/27/2022 0852    PCP is K Price FNP   Current Medications, Allergies, Past Medical History, Past Surgical History, Family History and Social History were reviewed in [employer] record.     Review of Systems: Patient denies any headaches, hearing loss, fatigue, blurred vision, shortness of breath, chest pain, abdominal pain, problems with bowel movements, urination, or intercourse. No joint pain or mood swings.  Denies any vaginal bleeding Has gained some weight she says   Physical Exam:BP (!) 159/91 (BP Location: Right Arm, Patient Position: Sitting, Cuff Size: Large)   Pulse 64   Ht 5\' 7"  (1.702 m)   Wt 227 lb 8 oz (103.2 kg)   BMI 35.63 kg/m   General:  Well developed, well nourished, no acute distress Skin:  Warm and dry Neck:  Midline trachea, normal thyroid , good ROM, no lymphadenopathy,no carotid bruits heard  Lungs; Clear to auscultation bilaterally Breast:  No dominant palpable mass, retraction, or nipple discharge Cardiovascular: Regular rate and rhythm Abdomen:  Soft, non tender, no hepatosplenomegaly Pelvic:  External genitalia is normal in appearance, no lesions.  The vagina is pale. Urethra has no lesions or masses. The cervix is  smooth.  Uterus is felt to be normal size, shape, and contour.  No adnexal masses or tenderness noted.Bladder is non tender, no masses felt. Rectal: Good sphincter tone, no polyps, or hemorrhoids felt.  Hemoccult negative. Extremities/musculoskeletal:  No swelling or varicosities noted, no clubbing or cyanosis Psych:  No mood changes, alert and cooperative,seems happy AA is  0 Fall risk is low    01/11/2024    8:34 AM 12/27/2022    8:40 AM 10/26/2021    8:38 AM  Depression screen PHQ 2/9  Decreased Interest 0 0 0  Down, Depressed, Hopeless 0 0 0  PHQ - 2 Score 0 0 0  Altered sleeping 0 0   Tired, decreased energy 0 0   Change in appetite 0 0   Feeling bad or failure about yourself  0 0   Trouble concentrating 0 0   Moving slowly or fidgety/restless 0 0   Suicidal thoughts 0 0   PHQ-9 Score 0 0        01/11/2024    8:34 AM 12/27/2022    8:41 AM 10/26/2021    8:40 AM 10/09/2020   10:17 AM  GAD 7 : Generalized Anxiety Score  Nervous, Anxious, on Edge 0 0 0 0  Control/stop worrying 0 0 0 0  Worry too much - different things 0 0 0 1  Trouble relaxing 0 0 0 0  Restless 0 0 0 0  Easily annoyed or irritable 0 0 0 0  Afraid - awful might happen 0  0 0 0  Total GAD 7 Score 0 0 0 1    Upstream - 01/11/24 0839       Pregnancy Intention Screening   Does the patient want to become pregnant in the next year? N/A    Does the patient's partner want to become pregnant in the next year? N/A    Would the patient like to discuss contraceptive options today? N/A      Contraception Wrap Up   Current Method Female Sterilization   PM   End Method Female Sterilization   PM   Contraception Counseling Provided No              Examination chaperoned by Alphonso Aschoff LPN  Impression and plan: 1. Encounter for well woman exam with routine gynecological exam (Primary) Physical in 1 year Pap in 2027 Had mammogram this am Colonoscopy per GI Get DEXA Labs with PCP Stay active   2. Encounter for  screening fecal occult blood testing Hemoccult was negative  - POCT occult blood stool  3. Hypertension, unspecified type Take BP meds and follow up with PCP
